# Patient Record
Sex: Male | Born: 1950 | Race: Black or African American | Hispanic: No | Marital: Single
Health system: Southern US, Community
[De-identification: ages and names within clinical notes are randomized; demographics above are authoritative.]

---

## 1998-04-09 ENCOUNTER — Ambulatory Visit: Admission: RE | Admit: 1998-04-09 | Discharge: 1998-04-09 | Payer: Self-pay | Admitting: Pulmonary Disease

## 1998-10-22 ENCOUNTER — Ambulatory Visit (HOSPITAL_COMMUNITY): Admission: RE | Admit: 1998-10-22 | Discharge: 1998-10-22 | Payer: Self-pay | Admitting: Family Medicine

## 1999-06-26 ENCOUNTER — Ambulatory Visit: Admission: RE | Admit: 1999-06-26 | Discharge: 1999-06-26 | Payer: Self-pay | Admitting: Pulmonary Disease

## 1999-11-15 ENCOUNTER — Ambulatory Visit: Admission: RE | Admit: 1999-11-15 | Discharge: 1999-11-15 | Payer: Self-pay | Admitting: Pulmonary Disease

## 1999-12-26 ENCOUNTER — Ambulatory Visit (HOSPITAL_COMMUNITY): Admission: RE | Admit: 1999-12-26 | Discharge: 1999-12-26 | Payer: Self-pay | Admitting: Gastroenterology

## 2000-10-05 ENCOUNTER — Emergency Department (HOSPITAL_COMMUNITY): Admission: EM | Admit: 2000-10-05 | Discharge: 2000-10-05 | Payer: Self-pay | Admitting: Emergency Medicine

## 2001-08-01 ENCOUNTER — Emergency Department (HOSPITAL_COMMUNITY): Admission: EM | Admit: 2001-08-01 | Discharge: 2001-08-01 | Payer: Self-pay | Admitting: Emergency Medicine

## 2001-08-01 ENCOUNTER — Encounter: Payer: Self-pay | Admitting: Emergency Medicine

## 2004-05-05 ENCOUNTER — Encounter: Admission: RE | Admit: 2004-05-05 | Discharge: 2004-08-03 | Payer: Self-pay | Admitting: Neurology

## 2005-05-24 ENCOUNTER — Inpatient Hospital Stay (HOSPITAL_COMMUNITY): Admission: EM | Admit: 2005-05-24 | Discharge: 2005-06-01 | Payer: Self-pay | Admitting: Emergency Medicine

## 2005-05-26 ENCOUNTER — Encounter (INDEPENDENT_AMBULATORY_CARE_PROVIDER_SITE_OTHER): Payer: Self-pay | Admitting: Specialist

## 2005-06-23 ENCOUNTER — Emergency Department (HOSPITAL_COMMUNITY): Admission: EM | Admit: 2005-06-23 | Discharge: 2005-06-23 | Payer: Self-pay | Admitting: Emergency Medicine

## 2008-06-20 ENCOUNTER — Ambulatory Visit (HOSPITAL_COMMUNITY): Admission: RE | Admit: 2008-06-20 | Discharge: 2008-06-20 | Payer: Self-pay | Admitting: Family Medicine

## 2009-08-04 IMAGING — CR DG LUMBAR SPINE COMPLETE 4+V
5 series · 5 of 5 positions shown · non-contrast
Comparison: None

CLINICAL DATA: Back pain and left hip pain.

LUMBAR SPINE - COMPLETE 4+ VIEW

[t l-spine a.p.]
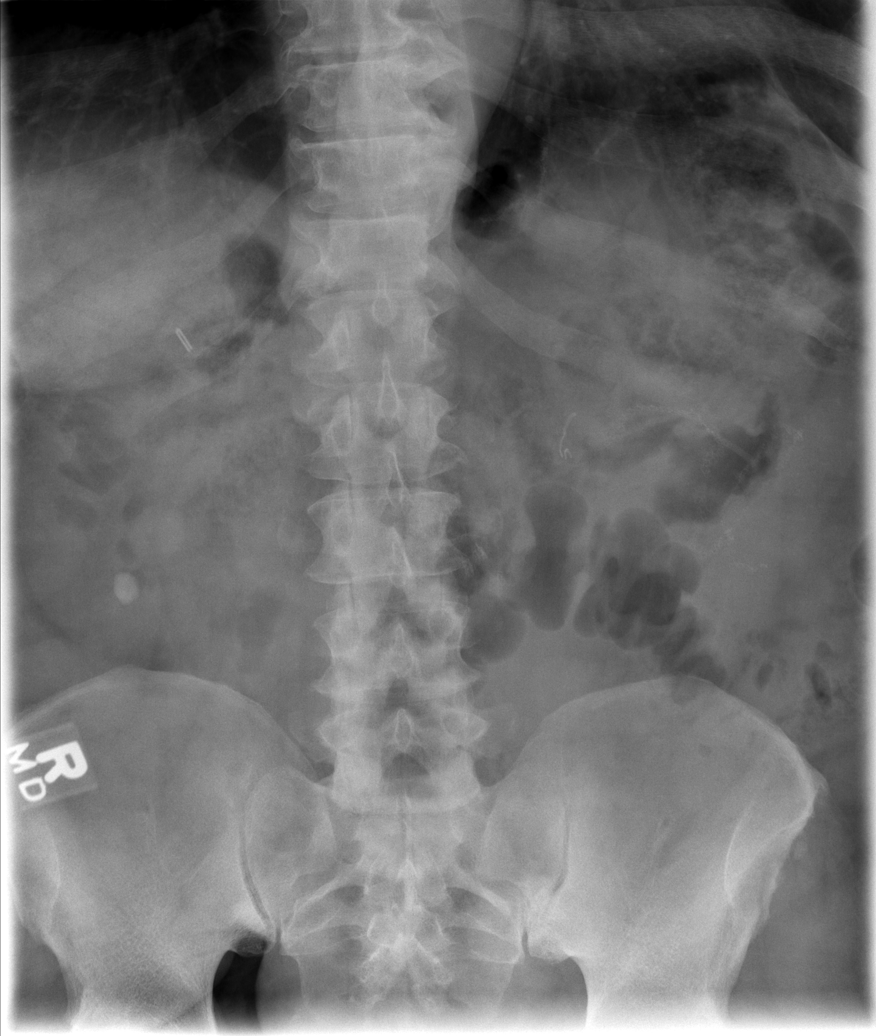

[t l-spine oblique exposure * (1 of 2)]
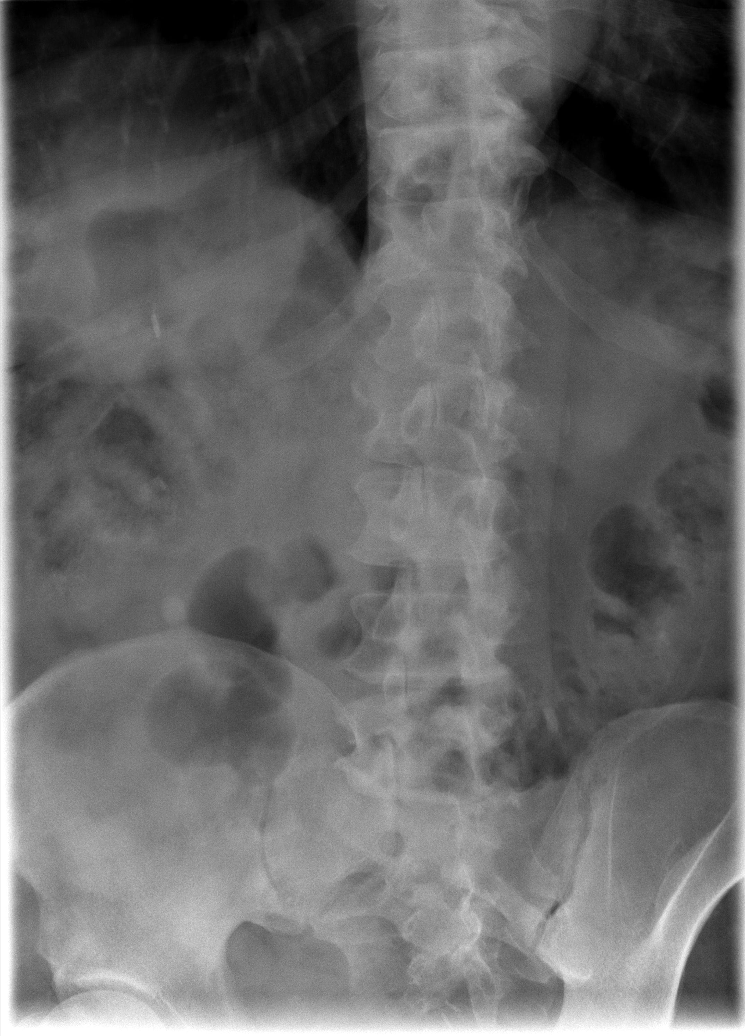

[t l-spine oblique exposure * (2 of 2)]
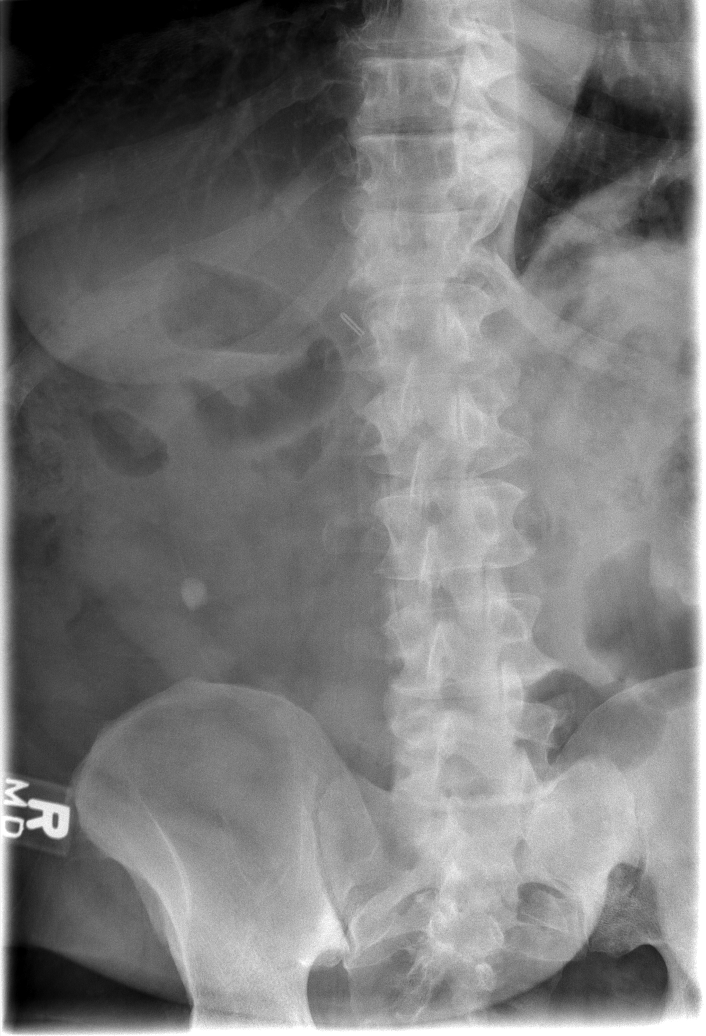

[t l-spine lat *]
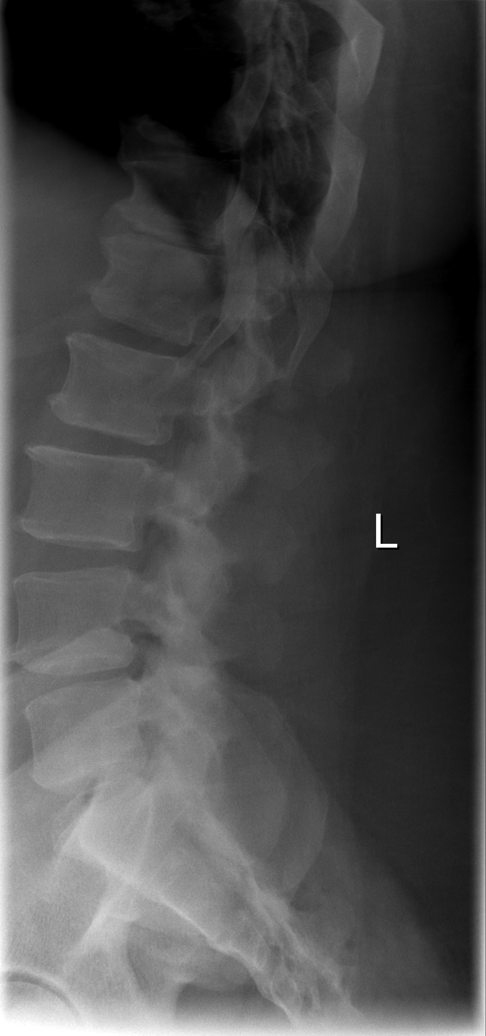

[t l-spine l5-s1 spot *]
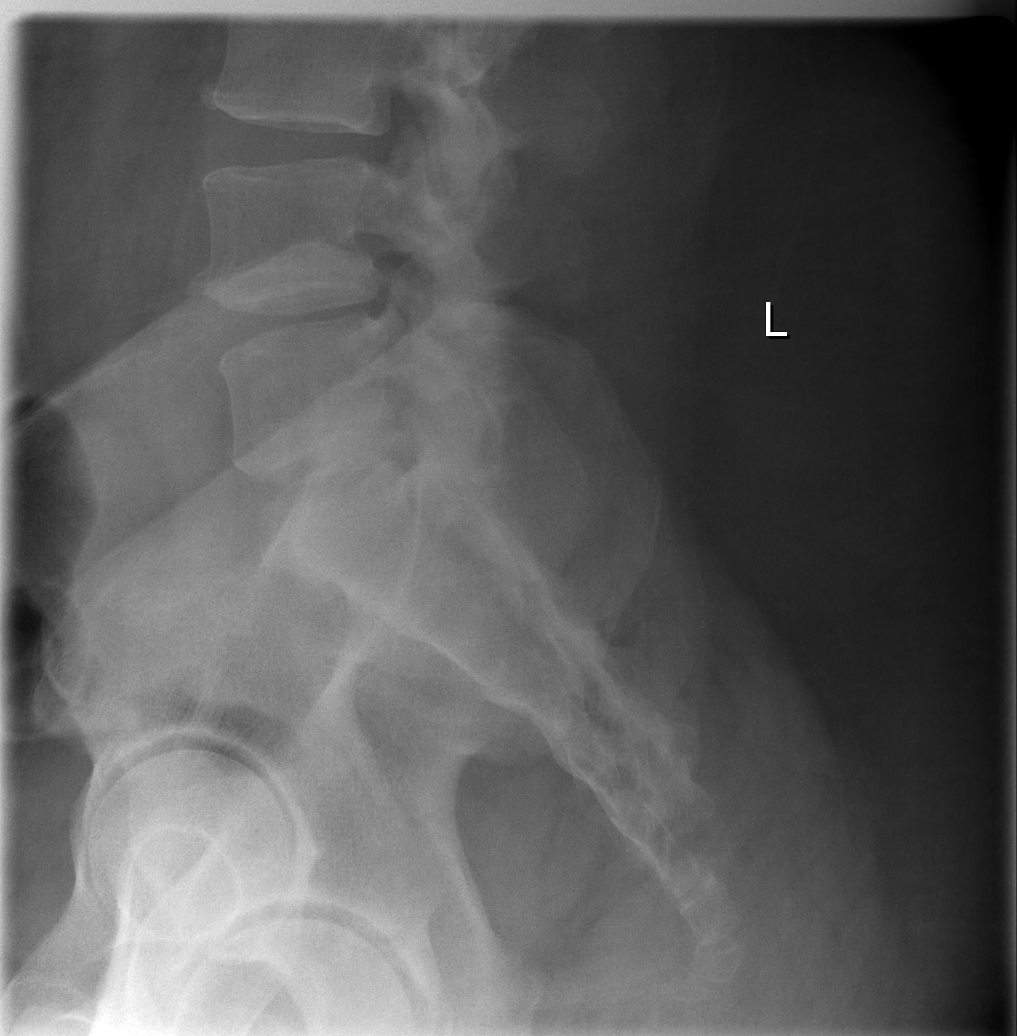

[5 of 5 positions shown; findings below may reference images not displayed]

FINDINGS: The lateral film demonstrates normal alignment of the
lumbar vertebral bodies.  There are mild degenerative changes with
marginal osteophytes.  Moderate facet disease.  No definite pars
defects.  No acute bony findings.  The visualized bony pelvis is
intact.  Right renal calculus again noted.
IMPRESSION: 1.  Normal alignment and no acute bony findings.
2.  Mild to moderate degenerative changes.

## 2011-04-21 NOTE — Procedures (Signed)
[place]  Patient:    Howard [name]                        MRN: [id number] Proc. Date: 12/26/99 Adm. Date:  [id number] Attending:  Nelda Marseille CC:         Petra Kuba, M.D.             Dellis Anes Idell Pickles, M.D.             Claria Dice, M.D.             at [facility]                           Procedure Report  PROCEDURE:  Colonoscopy.  INDICATION:  The patient with a questionable family history of colon cancer with some anal/rectal complaints, as well as guaiac positivity, bright red blood per  rectum.  PREMEDICATION:  Demerol 50 mg and Versed 5 mg.  INFORMED CONSENT:  Consent was signed after risks, benefits, methods, and options were thoroughly discussed in the office on multiple occasions.  DESCRIPTION OF PROCEDURE:  Rectal inspection is pertinent for tiny external hemorrhoids at best.  The general exam was negative.  The video colonoscope was inserted and fairly easily advanced around the colon o the cecum.  This did require some abdominal pressure but no position changes. n order to try to empty the terminal ileum, we did try to roll on first his back nd then his right side but were unsuccessful.  The scope was slowly withdrawn.  The prep was adequate.  There was some liquid stool that required washing and suctioning, but on slow withdrawal through the colon, other than some left-sided diverticuli, no other abnormalities were seen.  Once back in the rectum, the scope was retroflexed and pertinent for some internal hemorrhoids.  The scope was straightened and readvanced a short ways up the sigmoid.  Air was suctioned.  Scope removed.  The patient tolerated the procedure well.  There was no obvious immediate complications.  ENDOSCOPIC DIAGNOSES: 1. Internal/external hemorrhoids, small. 2. Left occasional diverticuli. 3. Otherwise within normal limits to the cecum.  PLAN:  Follow up in two months.  Recheck  symptoms and guaiac to make sure no further workup plans are needed.  Call me sooner p.r.n. DD:  12/26/99 TD:  12/26/99 Job: [id number]

## 2011-04-21 NOTE — Op Note (Signed)
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   OPERATION:  Excision of chronically ulcerated skin and drainage of abdominal  wall abscess.   SURGEON:  Lebron Conners, M.D.   ANESTHESIA:  General.   DESCRIPTION OF PROCEDURE:  After the patient was monitored and had general  anesthesia and routine preparation and draping of the abdomen, I cut the  skin all the way around the chronic ulcerated area just to the right of the  lower midline.  I got hemostasis in the skin and dissected with cautery down  through subcutaneous tissue until I entered the large known abscess cavity.  I cut out the spontaneous abscess opening in the lower abdomen which I had  enlarged earlier. The abscess cavity was filled with a large amount of pus  and fibrinous exudative material.  I removed that by irrigation and wiping  it.  There was some granulation tissue present.  There was no evidence of  any communication into the abdominal cavity, and this all appeared to be  anterior to the muscles.  I irrigated and got it out and got good hemostasis  and then filled that cavity with DAC sponge and applied back bandage to  suction.  That seemed effective.  The patient was stable throughout.       WB/MEDQ  D:  05/26/2005  T:  05/26/2005  Job:  [id number]

## 2011-04-21 NOTE — Discharge Summary (Signed)
NAMEALTAN, Howard [name]                ACCOUNT NO.:  [id number]   MEDICAL RECORD NO.:  [id number]          PATIENT TYPE:  INP   LOCATION:  1607                         FACILITY:  [facility]   PHYSICIAN:  Lorre Munroe., M.D.DATE OF BIRTH:  22-May-1951   DATE OF ADMISSION:  05/24/2005  DATE OF DISCHARGE:  06/01/2005                                 DISCHARGE SUMMARY   HISTORY:  This is a 60 year old black man who has suffered for a long time  from morbid obesity and has had multiple operations. He had developed an  abdominal wall abscess with drainage in the anterior abdominal wall and had  become quite problematic for him. He had a undergone bypass operation about  10 years earlier at [facility] which he had a great deal of weight loss and he had  extreme diarrhea and was on TPN for a year. He eventually had reversal of  that and gained a great deal of weight back. He knows that he had had mesh  put in the abdominal wall. He developed spontaneous drainage. The patient  was admitted to the hospital after opening of the abscess in the emergency  department. He was put in for IV antibiotics and intensive wound management.   Past history is significant for hypertension. No heart trouble. He has  obstructive sleep apnea but does not use C-PAP. He has a tracheostomy which  he opens at night which helps him sleep better. No lung disease known. No GI  disease prior to the surgery. See the history and physical for further  details.   HOSPITAL COURSE:  I took the patient in and put him on broad-spectrum  antibiotics. On May 26, 2005, I took the patient to the operating room and  did extensive excision of ulcerated skin and wide drainage of a large  abscess and application of a vacuum assisted closure device. He tolerated  the operation without complication. He was kept in the hospital for a while  following that and arrangements were made for the patient to have wound care  as an outpatient. He was to be  seen by nursing service for management of  vacuum-assisted closure and I made arrangements for follow-up in the office.  When arrangements were complete and he was afebrile and feeling well enough,  he was sent home. He had no evidence of enterocutaneous fistula. He was  eating well and pain well-controlled.   DIAGNOSES:  1.  Abdominal wall abscess, extensive.  2.  Morbid obesity.  3.  Hypertension.  4.  Obstructive sleep apnea.  5.  Tracheostomy status.   OPERATION:  Drainage of abdominal abscess, debridement of skin, application  of vacuum assisted closure device.   DISCHARGE CONDITION:  Improved.      Lorre Munroe., M.D.  Electronically Signed     WB/MEDQ  D:  09/06/2005  T:  09/06/2005  Job:  [id number]

## 2011-04-21 NOTE — H&P (Signed)
[name], [name] NO.:  [id number]   MEDICAL RECORD NO.:  [id number]          PATIENT TYPE:  EMS   LOCATION:  ED                           FACILITY:  [facility]   PHYSICIAN:  Lorre Munroe., M.D.DATE OF BIRTH:  [birth year]   DATE OF ADMISSION:  05/24/2005  DATE OF DISCHARGE:                                HISTORY & PHYSICAL   CHIEF COMPLAINT:  Drainage from the abdominal wall.   PRESENT ILLNESS:  The patient is a 60 year old black male who is morbidly  obese.  About 10 years ago, he underwent a bypass operation, by Dr. Claria [name] at [facility], which resulted in a great deal of weight loss, but  he had extreme diarrhea whenever he ate food, and he had to be on TPN for  about a year. This operation was eventually reversed and extensive plastic  surgical procedure was done to his abdominal wall.  The patient initially  weighed almost 600 pounds.  He had been doing fairly well though he had  gained a great deal of weight back.  He had a chronic ulcerated area on the  anterior abdominal wall.  Lately, he had increased bad feeling in that area  and last night he had spontaneous drainage of a great deal of cloudy fluid  from the lower abdominal skin.  The patient came to the emergency department  for evaluation.  He has not had fever and chills. He has been able to eat  but has felt so badly that he has not eaten much for about a month, he days.  In the emergency department, I saw the patient and found a fluctuant area in  the right lower anterior abdominal wall at the site of a wound.  I opened  that under local anesthesia with drainage of copious thin, brownish fluid.  Culture was taken.  It did not have a particularly bad odor.  I could put my  finger in and feel a very large cavity and I think he needs to be  hospitalized for control of the drainage, management of wound problems, and  investigation to rule out enterocutaneous fistula.   PAST MEDICAL  HISTORY:  1.  Hypertension and takes Lotrel.  2.  He takes some over-the-counter electrolytes tablets which he brought at      the grocery store.  3.  He denies heart trouble.  4.  He has a history of obstructive sleep apnea.  He does not use a C-PAP      device but has a tracheostomy which he opens at night because he sleeps      much better.  He denies any other intrinsic lung disease.  5.  He denies GI disease prior to this is surgery.  6.  He denies other serious chronic problems.   No medicine allergies.   No medicines except as mentioned above and some multivitamins.   FAMILY HISTORY:  Unremarkable.   CHILDHOOD ILLNESSES:  Unremarkable.   SOCIAL HISTORY:  The patient is independent and works cleaning up offices.  He does not smoke or drink.  REVIEW OF SYSTEMS:  Denies chest pain, shortness of breath, diarrhea, denies  vomiting, denies urinary difficulty.   PHYSICAL EXAM:  VITAL SIGNS:  Temperature and vital signs unremarkable as  recorded by nurse.  MENTAL STATUS:  Normal.  HEENT:  Unremarkable except for a tracheostomy in place.  CHEST:  Clear to auscultation.  HEART:  Rate and rhythm normal.  No murmur or gallop.  ABDOMEN:  Massively obese.  There is a great deal of induration and some  tenderness of the lower abdomen.  There was a fluctuant area which I  drained, as noted above.  There is no redness of the skin.  EXTREMITIES:  There is trace of edema.  Good pulses were present.  No  ulcerations on the extremities.  NEUROLOGICAL:  Grossly normal.  LYMPH NODES:  None palpably enlarged.  SKIN:  No lesions noted, except on the abdominal wall.   IMPRESSION:  1.  Abdominal wall abscess, possible enterocutaneous fistula.  2.  Morbid obesity.  3.  Hypertension.   PLAN:  1.  Hospitalization with IV antibiotics and close follow-up.  2.  I will get a CT scan to evaluate the abdominal wall situation further.       WB/MEDQ  D:  05/24/2005  T:  05/24/2005  Job:   [id number]

## 2014-09-18 ENCOUNTER — Other Ambulatory Visit: Payer: Self-pay | Admitting: Nurse Practitioner

## 2014-09-18 ENCOUNTER — Ambulatory Visit
Admission: RE | Admit: 2014-09-18 | Discharge: 2014-09-18 | Disposition: A | Discharge status: Home / Self Care | Payer: BC Managed Care – PPO | Source: Ambulatory Visit | Attending: Nurse Practitioner | Admitting: Nurse Practitioner

## 2014-09-18 DIAGNOSIS — T148XXA Other injury of unspecified body region, initial encounter: Secondary | ICD-10-CM

## 2015-08-20 ENCOUNTER — Encounter (HOSPITAL_COMMUNITY): Disposition: E | Payer: Self-pay | Source: Other Acute Inpatient Hospital | Attending: Cardiology

## 2015-08-20 ENCOUNTER — Ambulatory Visit (HOSPITAL_COMMUNITY)
Admit: 2015-08-20 | Discharge: 2015-09-04 | Disposition: E | Payer: BC Managed Care – PPO | Source: Other Acute Inpatient Hospital | Attending: Cardiology | Admitting: Cardiology

## 2015-08-20 ENCOUNTER — Ambulatory Visit (HOSPITAL_COMMUNITY): Payer: BC Managed Care – PPO | Admitting: Certified Registered"

## 2015-08-20 DIAGNOSIS — I469 Cardiac arrest, cause unspecified: Secondary | ICD-10-CM | POA: Diagnosis not present

## 2015-08-20 DIAGNOSIS — R570 Cardiogenic shock: Secondary | ICD-10-CM | POA: Insufficient documentation

## 2015-08-20 DIAGNOSIS — G4733 Obstructive sleep apnea (adult) (pediatric): Secondary | ICD-10-CM | POA: Insufficient documentation

## 2015-08-20 DIAGNOSIS — I4510 Unspecified right bundle-branch block: Secondary | ICD-10-CM | POA: Insufficient documentation

## 2015-08-20 DIAGNOSIS — R000 Tachycardia, unspecified: Secondary | ICD-10-CM | POA: Diagnosis not present

## 2015-08-20 DIAGNOSIS — Z9884 Bariatric surgery status: Secondary | ICD-10-CM | POA: Insufficient documentation

## 2015-08-20 DIAGNOSIS — I10 Essential (primary) hypertension: Secondary | ICD-10-CM | POA: Insufficient documentation

## 2015-08-20 DIAGNOSIS — I2119 ST elevation (STEMI) myocardial infarction involving other coronary artery of inferior wall: Secondary | ICD-10-CM

## 2015-08-20 HISTORY — PX: CARDIAC CATHETERIZATION: SHX172

## 2015-08-20 SURGERY — INVASIVE LAB ABORTED CASE

## 2015-08-20 MED ORDER — SODIUM BICARBONATE 8.4 % IV SOLN
INTRAVENOUS | Status: AC
  Filled 2015-08-20: qty 100

## 2015-08-20 MED ORDER — SODIUM BICARBONATE 8.4 % IV SOLN: 50.0000 meq | Freq: Once | INTRAVENOUS | Status: DC

## 2015-08-20 MED ORDER — SODIUM BICARBONATE 8.4 % IV SOLN
INTRAVENOUS | Status: AC
  Filled 2015-08-20: qty 50

## 2015-08-20 MED ORDER — EPINEPHRINE HCL 0.1 MG/ML IJ SOSY
PREFILLED_SYRINGE | INTRAMUSCULAR | Status: AC
  Filled 2015-08-20: qty 10

## 2015-08-20 MED ORDER — ATROPINE SULFATE 0.1 MG/ML IJ SOLN
INTRAMUSCULAR | Status: AC
  Filled 2015-08-20: qty 10

## 2015-08-20 MED ORDER — EPINEPHRINE HCL 0.1 MG/ML IJ SOSY
PREFILLED_SYRINGE | INTRAMUSCULAR | Status: AC
  Filled 2015-08-20: qty 40

## 2015-08-20 MED ORDER — SODIUM BICARBONATE 8.4 % IV SOLN
INTRAVENOUS | Status: DC | PRN
  Administered 2015-08-20 (×4): 50 meq via INTRAVENOUS

## 2015-08-20 MED ORDER — ATROPINE SULFATE 0.1 MG/ML IJ SOLN
INTRAMUSCULAR | Status: DC | PRN
Start: 2015-08-20 — End: 2015-08-20
  Administered 2015-08-20: 1 mg via INTRAVENOUS

## 2015-08-20 MED ORDER — SODIUM BICARBONATE 8.4 % IV SOLN
50.0000 meq | Freq: Once | INTRAVENOUS | Status: DC
Start: 2015-08-20 — End: 2015-08-21

## 2015-08-20 MED ORDER — EPINEPHRINE HCL 0.1 MG/ML IJ SOSY
PREFILLED_SYRINGE | INTRAMUSCULAR | Status: DC | PRN
Start: 2015-08-20 — End: 2015-08-20
  Administered 2015-08-20 (×5): 1 mg via INTRAVENOUS

## 2015-08-20 SURGICAL SUPPLY — 8 items
GLIDESHEATH SLEND SS 6F .021 (SHEATH) ×2 IMPLANT
KIT ENCORE 26 ADVANTAGE (KITS) IMPLANT
KIT HEART LEFT (KITS) ×2 IMPLANT
PACK CARDIAC CATHETERIZATION (CUSTOM PROCEDURE TRAY) ×2 IMPLANT
SHEATH PINNACLE 6F 10CM (SHEATH) IMPLANT
TRANSDUCER W/STOPCOCK (MISCELLANEOUS) ×2 IMPLANT
TUBING CIL FLEX 10 FLL-RA (TUBING) ×2 IMPLANT
WIRE EMERALD 3MM-J .035X150CM (WIRE) IMPLANT

## 2015-08-23 ENCOUNTER — Encounter (HOSPITAL_COMMUNITY): Payer: Self-pay | Admitting: Cardiovascular Disease

## 2015-08-25 ENCOUNTER — Telehealth: Payer: Self-pay | Admitting: Cardiovascular Disease

## 2015-08-25 NOTE — Telephone Encounter (Signed)
D/C faxed to [facility] Dr.Arida will be there on Thursday, spoke with Hirra she is aware this will be faxed and to get Arida to sign and fax back to me.

## 2015-08-25 NOTE — Telephone Encounter (Signed)
D/C received on patient. Staff Message being sent to See if Dr.Arida will sign.

## 2015-08-26 ENCOUNTER — Telehealth: Payer: Self-pay | Admitting: Cardiovascular Disease

## 2015-08-26 NOTE — Telephone Encounter (Signed)
D/C for this patient signed by Dr.Arida,we faxed copy to [place] office he completed. I called spoke with Mickie Bail this am at [facility] he is aware I faxed copy of d/c to  His office and the original will get signed Tuesday 08/31/15 he ok'd.  [facility]  [phone] Mickie Bail

## 2015-08-31 ENCOUNTER — Telehealth: Payer: Self-pay | Admitting: Cardiovascular Disease

## 2015-08-31 NOTE — Telephone Encounter (Signed)
[facility] called Dr.Arida signed original copy of d/c [facility] aware ready for pick up.  P) [phone]

## 2015-09-04 NOTE — Progress Notes (Signed)
   Sep 08, 2015 2100  Clinical Encounter Type  Visited With Family  Visit Type Death  Referral From Nurse  Stress Factors  Patient Stress Factors Other (Comment)  Family Stress Factors (Funeral arrangements)  Chaplain spoke with family; Chaplain took family to view body; Chaplain escorted family out

## 2015-09-04 NOTE — Progress Notes (Signed)
   September 11, 2015 1900  Clinical Encounter Type  Visited With Health care provider  Visit Type Code  Referral From Nurse  Chaplain responded to Code page; Chaplain found no family of Pt; Chaplain upon request as needed

## 2015-09-04 NOTE — H&P (Signed)
   History and Physical  Patient ID: XAI FRERKING MRN: [id number] DOB/AGE: 64-May-1952 65 y.o. Admit date: 2015/09/03  Primary Care Physician: No PCP Per Patient Primary Cardiologist : new  HPI:  This is a 64 year old African-American male with reported history of prior myocardial infarction in the 90s, morbid obesity status post gastric bypass many years ago with complications, obstructive sleep apnea on trach and hypertension. He presented with chest pain that started about 2 days ago. The chest pain worsened today and he called EMS. EKG on presentation showed sinus tachycardia with right bundle branch block and massive inferior ST elevation. A code STEMI was called. The patient was brought directly to the cardiac cath lab where he reported 10 out of 10 chest pain. He was cold and clammy with initial blood pressure in the 90s systolic.  As the patient was being draped for cardiac catheterization, he went into PDA arrest. CPR was started. The rhythm mostly was PDA and asystole throughout the code. There was one period of time where ventricular fibrillation was noted and thus he was shocked one time but again went into asystole. He was given a total of 5 rounds of epinephrine and 3 A of sodium bicarbonate and atropine. CPR was continued throughout this period.  however, there was no return of spontaneous circulation. The patient was pronounced dead at 19:54  ew of systems complete and found to be negative unless listed above  No past medical history on file.  No family history on file.  Social History   Social History  . Marital Status: Single    Spouse Name: N/A  . Number of Children: N/A  . Years of Education: N/A   Occupational History  . Not on file.   Social History Main Topics  . Smoking status: Not on file  . Smokeless tobacco: Not on file  . Alcohol Use: Not on file  . Drug Use: Not on file  . Sexual Activity: Not on file   Other Topics Concern  . Not on file   Social  History Narrative  . No narrative on file    No past surgical history on file.   No prescriptions prior to admission    Physical Exam: Height 6' (1.829 m), weight 400 lb (181.439 kg).  Labs:  No results found for: WBC, HGB, HCT, MCV, PLT No results for input(s): NA, K, CL, CO2, BUN, CREATININE, CALCIUM, PROT, BILITOT, ALKPHOS, ALT, AST, GLUCOSE in the last 168 hours.  Invalid input(s): LABALBU No results found for: CKTOTAL, CKMB, CKMBINDEX, TROPONINI No results found for: CHOL No results found for: HDL No results found for: LDLCALC No results found for: TRIG No results found for: CHOLHDL No results found for: LDLDIRECT    Radiology: No results found.  EKG: Sinus tachycardia, RBBB with inferior STE  ASSESSMENT AND PLAN:   Cardiac arrest and cardiogenic shock due to massive inferior ST elevation myocardial infarction with late presentation . Unfortunately, the patient coded before any procedure could be done. In spite of aggressive CPR and resuscitation, and there was no return of spontaneous circulation. I updated the patient's niece who is the closest family member. The patient lives by himself.    critical care time: 60 minutes.   Signed: Lorine Bears MD, [facility] 09/03/15, 8:00 PM

## 2015-09-04 DEATH — deceased
